# Patient Record
Sex: Female | Born: 2000 | Hispanic: No | Marital: Single | Smoking: Never smoker
Health system: Southern US, Community
[De-identification: ages and names within clinical notes are randomized; demographics above are authoritative.]

## PROBLEM LIST (undated history)

## (undated) HISTORY — PX: KNEE SURGERY: SHX244

---
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
## 2019-03-06 ENCOUNTER — Other Ambulatory Visit: Payer: Self-pay

## 2019-03-06 ENCOUNTER — Ambulatory Visit
Admission: EM | Admit: 2019-03-06 | Discharge: 2019-03-06 | Disposition: A | Discharge status: Home / Self Care | Payer: Self-pay | Attending: Family Medicine | Admitting: Family Medicine

## 2019-03-06 ENCOUNTER — Encounter: Payer: Self-pay | Admitting: Emergency Medicine

## 2019-03-06 DIAGNOSIS — L309 Dermatitis, unspecified: Secondary | ICD-10-CM

## 2019-03-06 DIAGNOSIS — L709 Acne, unspecified: Secondary | ICD-10-CM

## 2019-03-06 MED ORDER — AMOXICILLIN 500 MG PO TABS: 500.0000 mg | ORAL_TABLET | Freq: Two times a day (BID) | ORAL | 0 refills | Status: DC

## 2019-03-06 MED ORDER — TRIAMCINOLONE ACETONIDE 0.1 % EX CREA: 1.0000 "application " | TOPICAL_CREAM | Freq: Two times a day (BID) | CUTANEOUS | 0 refills | Status: DC

## 2019-03-06 NOTE — Discharge Instructions (Signed)
Zyrtec daily plus benadryl as needed

## 2019-03-06 NOTE — ED Triage Notes (Signed)
Patient c/o red itchy bumps on her arms and legs that started 4 months ago.  Patient Patient states that she has not tried any OTC medicines for her itching.

## 2019-03-07 ENCOUNTER — Ambulatory Visit
Admission: EM | Admit: 2019-03-07 | Discharge: 2019-03-07 | Disposition: A | Discharge status: Home / Self Care | Payer: Self-pay | Attending: Family Medicine | Admitting: Family Medicine

## 2019-03-07 ENCOUNTER — Emergency Department
Admission: EM | Admit: 2019-03-07 | Discharge: 2019-03-07 | Disposition: A | Discharge status: Home / Self Care | Payer: Self-pay | Attending: Emergency Medicine | Admitting: Emergency Medicine

## 2019-03-07 ENCOUNTER — Other Ambulatory Visit: Payer: Self-pay

## 2019-03-07 ENCOUNTER — Encounter: Payer: Self-pay | Admitting: Emergency Medicine

## 2019-03-07 DIAGNOSIS — L709 Acne, unspecified: Secondary | ICD-10-CM | POA: Insufficient documentation

## 2019-03-07 DIAGNOSIS — R21 Rash and other nonspecific skin eruption: Secondary | ICD-10-CM | POA: Insufficient documentation

## 2019-03-07 MED ORDER — PERMETHRIN 5 % EX CREA: TOPICAL_CREAM | CUTANEOUS | 0 refills | Status: AC

## 2019-03-07 NOTE — Discharge Instructions (Addendum)
Please continue the medicines that were prescribed by urgent care.  Please follow-up with primary care for recheck.

## 2019-03-07 NOTE — Discharge Instructions (Signed)
Medication as prescribed.  If persists, see Dermatology. I recommend [facility].  Take care  Dr. Adriana Simas

## 2019-03-07 NOTE — ED Triage Notes (Signed)
Pt presents with c/o rash/itchy spots on her body. She states she believes there are mites in her apartment. She states she can see them flying around and she has seen eggs all over her apartment and in her car. She has an area in the inside of her right forearm that is red and itchy. She states when she was in the shower this morning the area was very itchy, she scratched it and picked at it and "a long white thing was moving under her skin". She has pointed out several other small areas, one on her left leg and one on her left hip area, that she states are where bugs have "been under her skin". She is very concerned about mites or scabies.

## 2019-03-07 NOTE — ED Notes (Signed)
Pt states she's had mark on right forearm for about three months but denies injury or cause. Pt states that today she used some tweezers and pulled a "clear, like parasite that fell down on my arm and moved and then crawled back in." Pt states it is itchy and painful at site. Redness and swelling noted on right forearm. CMS intact. NAD noted. Pt behavior, speech, and eye contact/disposition is appropriate.

## 2019-03-07 NOTE — ED Provider Notes (Signed)
[facility] Emergency Department Provider Note  ____________________________________________  Time seen: Approximately 8:56 PM  I have reviewed the triage vital signs and the nursing notes.   HISTORY  Chief Complaint Abscess    HPI Natalie Ruiz is a 19 y.o. female that presents to the emergency department with concerns of a parasite in her right forearm.  Patient went to urgent care just prior to coming to the emergency department.  She states that urgent care told her that they did not think that she think she had a parasite in her arm but gave her a permethrin cream.  She came to the emergency department because she wants it taken out.  Patient states that she sees bugs flying around her house, worms next to her shower, eggs on blankets in her car.  Patient is here today with her girlfriend, who also has a rash to her arm.  Patient denies any previous psych history.  No suicidal or homicidal ideations.  Patient does not take any medications regularly.   History reviewed. No pertinent past medical history.  There are no problems to display for this patient.   Past Surgical History:  Procedure Laterality Date  . KNEE SURGERY      Prior to Admission medications   Medication Sig Start Date End Date Taking? Authorizing Provider  permethrin (ELIMITE) 5 % cream Massage cream from head to soles of feet; leave on for 8 to 14 hours before removing (shower or bath); 03/07/19   Lacinda Axon, Michael Boston G, DO  fluticasone (FLONASE) 50 MCG/ACT nasal spray Place 1 spray into both nostrils 2 (two) times daily. 07/27/17 12/10/18  Cuthriell, Charline Bills, PA-C    Allergies Patient has no known allergies.  Family History  Problem Relation Age of Onset  . Healthy Mother   . Healthy Father     Social History Social History   Tobacco Use  . Smoking status: Never Smoker  . Smokeless tobacco: Never Used  Substance Use Topics  . Alcohol use: Never  . Drug use: Yes    Types: Marijuana      Review of Systems  Constitutional: No fever/chills Cardiovascular: No chest pain. Respiratory: No SOB. Gastrointestinal: No abdominal pain.  No nausea, no vomiting.  Musculoskeletal: Negative for musculoskeletal pain. Skin: Negative for abrasions, lacerations, ecchymosis. Positive for rash. Neurological: Negative for headaches   ____________________________________________   PHYSICAL EXAM:  VITAL SIGNS: ED Triage Vitals  Enc Vitals Group     BP 03/07/19 1928 130/67     Pulse Rate 03/07/19 1928 87     Resp 03/07/19 1928 17     Temp 03/07/19 1928 98.3 F (36.8 C)     Temp Source 03/07/19 1928 Oral     SpO2 03/07/19 1928 99 %     Weight 03/07/19 1928 125 lb (56.7 kg)     Height 03/07/19 1928 5\' 3"  (1.6 m)     Head Circumference --      Peak Flow --      Pain Score 03/07/19 1927 2     Pain Loc --      Pain Edu? --      Excl. in [place]? --      Constitutional: Alert and oriented. Well appearing and in no acute distress. Eyes: Conjunctivae are normal. PERRL. EOMI. Head: Atraumatic.  Acne. ENT:      Ears:      Nose: No congestion/rhinnorhea.      Mouth/Throat: Mucous membranes are moist.  Neck: No stridor.  Cardiovascular: Normal rate, regular rhythm.  Good peripheral circulation. Respiratory: Normal respiratory effort without tachypnea or retractions. Lungs CTAB. Good air entry to the bases with no decreased or absent breath sounds. Musculoskeletal: Full range of motion to all extremities. No gross deformities appreciated. Neurologic:  Normal speech and language. No gross focal neurologic deficits are appreciated.  Skin:  Skin is warm, dry and intact.  Linear crusted lesion to right forearm.  No surrounding erythema. Psychiatric: Mood and affect are normal. Speech and behavior are normal. Patient exhibits appropriate insight and judgement.   ____________________________________________   LABS (all labs ordered are listed, but only abnormal results are  displayed)  Labs Reviewed - No data to display ____________________________________________  EKG   ____________________________________________  RADIOLOGY   No results found.  ____________________________________________    PROCEDURES  Procedure(s) performed:    Procedures    Medications - No data to display   ____________________________________________   INITIAL IMPRESSION / ASSESSMENT AND PLAN / ED COURSE  Pertinent labs & imaging results that were available during my care of the patient were reviewed by me and considered in my medical decision making (see chart for details).  Review of the [place] CSRS was performed in accordance of the [facility] prior to dispensing any controlled drugs.   Patient presents emergency department for evaluation of rash.  Vital signs and exam are reassuring.  Patient was evaluated yesterday at urgent care and was started on amoxicillin and triamcinolone cream.  Today she was evaluated again by urgent care and was started on permethrin cream.  Patient has a scab to her forearm.  No indication of infection.  It is not consistent with an insect bite.  Patient is to follow up with primary care or dermatology as directed. Patient is given ED precautions to return to the ED for any worsening or new symptoms.  Natalie Ruiz was evaluated in Emergency Department on 03/07/2019 for the symptoms described in the history of present illness. She was evaluated in the context of the global COVID-19 pandemic, which necessitated consideration that the patient might be at risk for infection with the SARS-CoV-2 virus that causes COVID-19. Institutional protocols and algorithms that pertain to the evaluation of patients at risk for COVID-19 are in a state of rapid change based on information released by regulatory bodies including the CDC and federal and state organizations. These policies and algorithms were followed during the patient's care in the  ED.   ____________________________________________  FINAL CLINICAL IMPRESSION(S) / ED DIAGNOSES  Final diagnoses:  Rash  Acne, unspecified acne type      NEW MEDICATIONS STARTED DURING THIS VISIT:  ED Discharge Orders    None          This chart was dictated using voice recognition software/Dragon. Despite best efforts to proofread, errors can occur which can change the meaning. Any change was purely unintentional.    Enid Derry, PA-C 03/07/19 2330    Charlynne Pander, MD 03/08/19 [phone]

## 2019-03-07 NOTE — ED Triage Notes (Signed)
Pt arrives with spot on her right forearm. Pt states that she scratched the spot and something long came out of it. Pt reports having a "parasite in her arm". Pt is in NAD.

## 2019-03-08 NOTE — ED Provider Notes (Signed)
[facility]    CSN: [id number] Arrival date & time: 03/07/19  1545   History   Chief Complaint Chief Complaint  Patient presents with  . Rash   HPI  19 year old female presents with rash.  Patient reports that she has recently developed a rash all over. She believes there is a mite infestation in her appt. She describes "eggs" in her sheets/bed. She reports skin itches. Reports scattered areas of erythema. Has an area on her right forearm that she picked at and she felt something under her skin. No relieving factors. No other associated symptoms. No other complaints.    Home Medications    Prior to Admission medications   Medication Sig Start Date End Date Taking? Authorizing Provider  permethrin (ELIMITE) 5 % cream Massage cream from head to soles of feet; leave on for 8 to 14 hours before removing (shower or bath); 03/07/19   Lacinda Axon, Michael Boston G, DO  fluticasone (FLONASE) 50 MCG/ACT nasal spray Place 1 spray into both nostrils 2 (two) times daily. 07/27/17 12/10/18  Cuthriell, Charline Bills, PA-C    Family History Family History  Problem Relation Age of Onset  . Healthy Mother   . Healthy Father     Social History Social History   Tobacco Use  . Smoking status: Never Smoker  . Smokeless tobacco: Never Used  Substance Use Topics  . Alcohol use: Never  . Drug use: Yes    Types: Marijuana     Allergies   Patient has no known allergies.   Review of Systems Review of Systems  Constitutional: Negative.   Skin: Positive for rash.   Physical Exam Triage Vital Signs ED Triage Vitals  Enc Vitals Group     BP 03/07/19 1608 121/79     Pulse Rate 03/07/19 1608 87     Resp 03/07/19 1608 18     Temp 03/07/19 1608 98.9 F (37.2 C)     Temp Source 03/07/19 1608 Oral     SpO2 03/07/19 1608 100 %     Weight 03/07/19 1607 130 lb (59 kg)     Height 03/07/19 1607 5\' 3"  (1.6 m)     Head Circumference --      Peak Flow --      Pain Score 03/07/19 1605 3     Pain Loc  --      Pain Edu? --      Excl. in [place]? --    Updated Vital Signs BP 121/79 (BP Location: Left Arm)   Pulse 87   Temp 98.9 F (37.2 C) (Oral)   Resp 18   Ht 5\' 3"  (1.6 m)   Wt 59 kg   LMP 03/07/2019 (Exact Date)   SpO2 100%   BMI 23.03 kg/m   Visual Acuity Right Eye Distance:   Left Eye Distance:   Bilateral Distance:    Right Eye Near:   Left Eye Near:    Bilateral Near:     Physical Exam Vitals and nursing note reviewed.  Constitutional:      General: She is not in acute distress.    Appearance: Normal appearance. She is not ill-appearing.  HENT:     Head: Normocephalic and atraumatic.  Eyes:     General:        Right eye: No discharge.        Left eye: No discharge.     Conjunctiva/sclera: Conjunctivae normal.  Cardiovascular:     Rate and Rhythm: Normal rate  and regular rhythm.  Pulmonary:     Effort: Pulmonary effort is normal.     Breath sounds: Normal breath sounds.  Skin:    Comments: 2 areas of rash noted - Right forearm and left lower abdomen. No other significant areas of rash noted. Rash is flat and erythematous.   Neurological:     Mental Status: She is alert.  Psychiatric:     Comments: Anxious.     UC Treatments / Results  Labs (all labs ordered are listed, but only abnormal results are displayed) Labs Reviewed - No data to display  EKG   Radiology No results found.  Procedures Procedures (including critical care time)  Medications Ordered in UC Medications - No data to display  Initial Impression / Assessment and Plan / UC Course  I have reviewed the triage vital signs and the nursing notes.  Pertinent labs & imaging results that were available during my care of the patient were reviewed by me and considered in my medical decision making (see chart for details).    18 year old female presents with rash. Etiology is uncertain. Patient is adamant that this is from mites or scabies. Empiric permethrin. Advised to see Derm if  persists.  Final Clinical Impressions(s) / UC Diagnoses   Final diagnoses:  Rash     Discharge Instructions     Medication as prescribed.  If persists, see Dermatology. I recommend [facility].  Take care  Dr. Adriana Simas    ED Prescriptions    Medication Sig Dispense Auth. Provider   permethrin (ELIMITE) 5 % cream Massage cream from head to soles of feet; leave on for 8 to 14 hours before removing (shower or bath); 60 g Tommie Sams, DO     PDMP not reviewed this encounter.   Tommie Sams, [place] 03/08/19 2158

## 2019-03-09 NOTE — ED Provider Notes (Signed)
[facility]    CSN: [id number] Arrival date & time: 03/06/19  1338      History   Chief Complaint Chief Complaint  Patient presents with  . Rash    HPI Natalie Ruiz is a 19 y.o. female.   19 yo female with a c/o red, itchy bumps on her arms and legs for the past 4 months. Also c/o "pimples" on her face. States she thinks this is due to mite infestation in her house. Denies any fevers, chills, drainage.    Rash   History reviewed. No pertinent past medical history.  There are no problems to display for this patient.   Past Surgical History:  Procedure Laterality Date  . KNEE SURGERY      OB History   No obstetric history on file.      Home Medications    Prior to Admission medications   Medication Sig Start Date End Date Taking? Authorizing Provider  permethrin (ELIMITE) 5 % cream Massage cream from head to soles of feet; leave on for 8 to 14 hours before removing (shower or bath); 03/07/19   Adriana Simas, Dorie Rank G, DO  fluticasone (FLONASE) 50 MCG/ACT nasal spray Place 1 spray into both nostrils 2 (two) times daily. 07/27/17 12/10/18  Cuthriell, Delorise Royals, PA-C    Family History Family History  Problem Relation Age of Onset  . Healthy Mother   . Healthy Father     Social History Social History   Tobacco Use  . Smoking status: Never Smoker  . Smokeless tobacco: Never Used  Substance Use Topics  . Alcohol use: Never  . Drug use: Yes    Types: Marijuana     Allergies   Patient has no known allergies.   Review of Systems Review of Systems  Skin: Positive for rash.     Physical Exam Triage Vital Signs ED Triage Vitals  Enc Vitals Group     BP 03/06/19 1353 (!) 129/52     Pulse Rate 03/06/19 1353 91     Resp 03/06/19 1353 14     Temp 03/06/19 1353 98.3 F (36.8 C)     Temp Source 03/06/19 1353 Oral     SpO2 03/06/19 1353 100 %     Weight 03/06/19 1350 125 lb (56.7 kg)     Height 03/06/19 1350 5\' 3"  (1.6 m)     Head Circumference --       Peak Flow --      Pain Score 03/06/19 1349 0     Pain Loc --      Pain Edu? --      Excl. in [place]? --    No data found.  Updated Vital Signs BP (!) 129/52 (BP Location: Right Arm)   Pulse 91   Temp 98.3 F (36.8 C) (Oral)   Resp 14   Ht 5\' 3"  (1.6 m)   Wt 56.7 kg   LMP 02/03/2019 (Approximate)   SpO2 100%   BMI 22.14 kg/m   Visual Acuity Right Eye Distance:   Left Eye Distance:   Bilateral Distance:    Right Eye Near:   Left Eye Near:    Bilateral Near:     Physical Exam Vitals and nursing note reviewed.  Constitutional:      General: She is not in acute distress.    Appearance: She is not toxic-appearing or diaphoretic.  Skin:    Comments: Scaly, erythematous and pinpoint papules on arms and legs; few, scattered pustules on face  c/w acne  Neurological:     Mental Status: She is alert.      UC Treatments / Results  Labs (all labs ordered are listed, but only abnormal results are displayed) Labs Reviewed - No data to display  EKG   Radiology No results found.  Procedures Procedures (including critical care time)  Medications Ordered in UC Medications - No data to display  Initial Impression / Assessment and Plan / UC Course  I have reviewed the triage vital signs and the nursing notes.  Pertinent labs & imaging results that were available during my care of the patient were reviewed by me and considered in my medical decision making (see chart for details).      Final Clinical Impressions(s) / UC Diagnoses   Final diagnoses:  Acne, unspecified acne type  Eczema, unspecified type     Discharge Instructions     Zyrtec daily plus benadryl as needed    ED Prescriptions    Medication Sig Dispense Auth. Provider   triamcinolone cream (KENALOG) 0.1 % Apply 1 application topically 2 (two) times daily. 30 g Norval Gable, MD   amoxicillin (AMOXIL) 500 MG tablet Take 1 tablet (500 mg total) by mouth 2 (two) times daily. 20 tablet Norval Gable, MD     1.  diagnosis reviewed with patient 2. rx as per orders above; reviewed possible side effects, interactions, risks and benefits  3. Recommend supportive treatment as above 4. Follow-up prn if symptoms worsen or don't improve  PDMP not reviewed this encounter.   Norval Gable, MD 03/09/19 1949

## 2019-07-22 ENCOUNTER — Telehealth: Payer: Self-pay | Admitting: General Practice

## 2019-07-22 NOTE — Telephone Encounter (Signed)
Individual has been contacted 3+ times regarding ED referral. No further attempts to contact individual will be made. 

## 2019-08-02 ENCOUNTER — Emergency Department (HOSPITAL_COMMUNITY)
Admission: EM | Admit: 2019-08-02 | Discharge: 2019-08-02 | Disposition: A | Discharge status: Home / Self Care | Payer: Self-pay | Attending: Emergency Medicine | Admitting: Emergency Medicine

## 2019-08-02 ENCOUNTER — Encounter (HOSPITAL_COMMUNITY): Payer: Self-pay

## 2019-08-02 ENCOUNTER — Other Ambulatory Visit: Payer: Self-pay

## 2019-08-02 ENCOUNTER — Emergency Department (HOSPITAL_COMMUNITY): Payer: Self-pay

## 2019-08-02 DIAGNOSIS — K649 Unspecified hemorrhoids: Secondary | ICD-10-CM | POA: Insufficient documentation

## 2019-08-02 DIAGNOSIS — R05 Cough: Secondary | ICD-10-CM | POA: Insufficient documentation

## 2019-08-02 DIAGNOSIS — Z20822 Contact with and (suspected) exposure to covid-19: Secondary | ICD-10-CM | POA: Insufficient documentation

## 2019-08-02 DIAGNOSIS — R072 Precordial pain: Secondary | ICD-10-CM | POA: Insufficient documentation

## 2019-08-02 DIAGNOSIS — F419 Anxiety disorder, unspecified: Secondary | ICD-10-CM | POA: Insufficient documentation

## 2019-08-02 DIAGNOSIS — R0602 Shortness of breath: Secondary | ICD-10-CM | POA: Insufficient documentation

## 2019-08-02 LAB — BASIC METABOLIC PANEL
Anion gap: 8 (ref 5–15)
BUN: 6 mg/dL (ref 6–20)
CO2: 26 mmol/L (ref 22–32)
Calcium: 9 mg/dL (ref 8.9–10.3)
Chloride: 104 mmol/L (ref 98–111)
Creatinine, Ser: 0.67 mg/dL (ref 0.44–1.00)
GFR calc Af Amer: >60 mL/min (ref >60)
GFR calc non Af Amer: >60 mL/min (ref >60)
Glucose, Bld: 85 mg/dL (ref 70–99)
Potassium: 3.6 mmol/L (ref 3.5–5.1)
Sodium: 138 mmol/L (ref 135–145)

## 2019-08-02 LAB — CBC
HCT: 37.3 % (ref 36.0–46.0)
Hemoglobin: 12.6 g/dL (ref 12.0–15.0)
MCH: 30 pg (ref 26.0–34.0)
MCHC: 33.8 g/dL (ref 30.0–36.0)
MCV: 88.8 fL (ref 80.0–100.0)
Platelets: 216 10*3/uL (ref 150–400)
RBC: 4.2 MIL/uL (ref 3.87–5.11)
RDW: 13 % (ref 11.5–15.5)
WBC: 8.2 10*3/uL (ref 4.0–10.5)
nRBC: 0 % (ref 0.0–0.2)

## 2019-08-02 LAB — TROPONIN I (HIGH SENSITIVITY): Troponin I (High Sensitivity): <2 ng/L (ref <18)

## 2019-08-02 LAB — I-STAT BETA HCG BLOOD, ED (MC, WL, AP ONLY): I-stat hCG, quantitative: <5 m[IU]/mL (ref <5)

## 2019-08-02 LAB — D-DIMER, QUANTITATIVE: D-Dimer, Quant: <0.27 ug/mL-FEU (ref 0.00–0.50)

## 2019-08-02 LAB — SARS CORONAVIRUS 2 BY RT PCR (HOSPITAL ORDER, PERFORMED IN ~~LOC~~ HOSPITAL LAB): SARS Coronavirus 2: NEGATIVE

## 2019-08-02 LAB — POC OCCULT BLOOD, ED: Fecal Occult Bld: NEGATIVE

## 2019-08-02 MED ORDER — HYDROXYZINE HCL 25 MG PO TABS: 25.0000 mg | ORAL_TABLET | Freq: Four times a day (QID) | ORAL | 0 refills | Status: AC

## 2019-08-02 MED ORDER — SODIUM CHLORIDE 0.9 % IV BOLUS
1000.0000 mL | Freq: Once | INTRAVENOUS | Status: AC
  Administered 2019-08-02: 1000 mL via INTRAVENOUS

## 2019-08-02 MED ORDER — POLYETHYLENE GLYCOL 3350 17 G PO PACK: 17.0000 g | PACK | Freq: Every day | ORAL | 0 refills | Status: AC

## 2019-08-02 MED ORDER — SODIUM CHLORIDE 0.9% FLUSH
3.0000 mL | Freq: Once | INTRAVENOUS | Status: AC
  Administered 2019-08-02: 3 mL via INTRAVENOUS

## 2019-08-02 NOTE — ED Notes (Signed)
Patient refused lab draw at this time.  

## 2019-08-02 NOTE — ED Notes (Signed)
Pt verbalizes understanding of DC instructions. Pt belongings returned and is ambulatory out of ED.  

## 2019-08-02 NOTE — ED Triage Notes (Signed)
Pt presents with c/o shortness of breath and blood in her stool. Pt also c/o pain in her chest, heaviness in nature associated with her shortness of breath. Pt reports the blood in her stool is recent.

## 2019-08-02 NOTE — Discharge Instructions (Signed)
Follow up with Primary care provider  Return for new or worsening symptoms 

## 2019-08-02 NOTE — ED Notes (Signed)
No call from pt when called for room x 1.  

## 2019-08-02 NOTE — ED Provider Notes (Signed)
[facility]-EMERGENCY DEPT Provider Note   CSN: [id number] Arrival date & time: 08/02/19  [id number]     History Chief Complaint  Patient presents with  . Shortness of Breath  . Rectal Bleeding  . Chest Pain    Natalie Ruiz is a 19 y.o. female with multiple complaints.  Patient with chest pain x1 month.  It is there constantly.  Not pleuritic or exertional in nature.  States occasionally she will get short of breath at night.  Patient states also at night she feels like she is "tingling sensation" throughout her whole body.  States she does have mind racing.  She denies any prior history of anxiety.  No unilateral leg swelling, redness or warmth.  No prior history of PE, DVT, family or personal history of clotting disorders.  No surgical history, malignancy, immobilization.  No hemoptysis.  Patient also states 2 days ago she had rectal bleeding with wiping.  States she had 1 day of diarrhea proximal 1 month ago however has had "normal" stools.  Her last bowel movement was 2 days ago.  She goes approximately every 2 to 3 days.  States her stools have been "firm."  She has not taken anything for this.  Has not had any subsequent rectal bleeding.  No headache, lightheadedness, dizziness, syncope, abdominal pain, diarrhea, dysuria.  Patient states after she eats a big meal she gets a "gurgling sensation."  In her stomach. No abd pain.  Denies additional aggravating or relieving factors.  Patient did state that her mother has had a cough as well as her and her mother was tested for Covid 2 days ago and is pending the results.  History obtained from patient and past medical records.  No interpreter used.  HPI     History reviewed. No pertinent past medical history.  There are no problems to display for this patient.   Past Surgical History:  Procedure Laterality Date  . KNEE SURGERY       OB History   No obstetric history on file.     Family History  Problem Relation  Age of Onset  . Healthy Mother   . Healthy Father     Social History   Tobacco Use  . Smoking status: Never Smoker  . Smokeless tobacco: Never Used  Vaping Use  . Vaping Use: Never used  Substance Use Topics  . Alcohol use: Never  . Drug use: Yes    Types: Marijuana    Home Medications Prior to Admission medications   Medication Sig Start Date End Date Taking? Authorizing Provider  hydrOXYzine (ATARAX/VISTARIL) 25 MG tablet Take 1 tablet (25 mg total) by mouth every 6 (six) hours. 08/02/19   Porsha Skilton A, PA-C  permethrin (ELIMITE) 5 % cream Massage cream from head to soles of feet; leave on for 8 to 14 hours before removing (shower or bath); Patient not taking: Reported on 08/02/2019 03/07/19   Tommie Samsook, Jayce G, DO  polyethylene glycol (MIRALAX) 17 g packet Take 17 g by mouth daily. 08/02/19   Konner Warrior A, PA-C  fluticasone (FLONASE) 50 MCG/ACT nasal spray Place 1 spray into both nostrils 2 (two) times daily. 07/27/17 12/10/18  Cuthriell, Delorise RoyalsJonathan D, PA-C    Allergies    Patient has no known allergies.  Review of Systems   Review of Systems  Constitutional: Negative.   HENT: Negative.   Respiratory: Positive for cough and shortness of breath (At night). Negative for apnea, choking, chest tightness, wheezing and stridor.  Cardiovascular: Positive for chest pain. Negative for palpitations and leg swelling.  Gastrointestinal: Negative.        Gurgling after eating  Genitourinary: Negative.   Musculoskeletal: Negative.   Skin: Negative.   Neurological: Negative.   All other systems reviewed and are negative.   Physical Exam Updated Vital Signs BP 120/75   Pulse 80   Temp 98.2 F (36.8 C) (Oral)   Resp 18   Ht 5\' 3"  (1.6 m)   Wt 56.7 kg   LMP 07/08/2019 (Approximate)   SpO2 99%   BMI 22.14 kg/m   Physical Exam Vitals and nursing note reviewed. Exam conducted with a chaperone present.  Constitutional:      General: She is not in acute distress.     Appearance: She is not ill-appearing, toxic-appearing or diaphoretic.  HENT:     Head: Normocephalic and atraumatic.     Jaw: There is normal jaw occlusion.     Right Ear: Tympanic membrane, ear canal and external ear normal. There is no impacted cerumen. No hemotympanum. Tympanic membrane is not injected, scarred, perforated, erythematous, retracted or bulging.     Left Ear: Tympanic membrane, ear canal and external ear normal. There is no impacted cerumen. No hemotympanum. Tympanic membrane is not injected, scarred, perforated, erythematous, retracted or bulging.     Ears:     Comments: No Mastoid tenderness.    Nose:     Comments: Clear rhinorrhea and congestion to bilateral nares.  No sinus tenderness.    Mouth/Throat:     Comments: Posterior oropharynx clear.  Mucous membranes moist.  Tonsils without erythema or exudate.  Uvula midline without deviation.  No evidence of PTA or RPA.  No drooling, dysphasia or trismus.  Phonation normal. Neck:     Trachea: Trachea and phonation normal.     Meningeal: Brudzinski's sign and Kernig's sign absent.     Comments: No Neck stiffness or neck rigidity.  No meningismus.  No cervical lymphadenopathy. Cardiovascular:     Comments: No murmurs rubs or gallops. Pulmonary:     Comments: Clear to auscultation bilaterally without wheeze, rhonchi or rales.  No accessory muscle usage.  Able speak in full sentences. Abdominal:     Comments: Soft, nontender without rebound or guarding.  No CVA tenderness.  Genitourinary:    Rectum: Guaiac result negative. Internal hemorrhoid present. No tenderness or external hemorrhoid.     Comments: Light brown stool in rectal vault. Occult negative. Musculoskeletal:     Comments: Moves all 4 extremities without difficulty.  Lower extremities without edema, erythema or warmth.  Skin:    Comments: Brisk capillary refill.  No rashes or lesions.  Neurological:     Mental Status: She is alert.     Comments: Ambulatory in  department without difficulty.  Cranial nerves II through XII grossly intact.  No facial droop.  No aphasia.    ED Results / Procedures / Treatments   Labs (all labs ordered are listed, but only abnormal results are displayed) Labs Reviewed  SARS CORONAVIRUS 2 BY RT PCR (HOSPITAL ORDER, PERFORMED IN [facility])  BASIC METABOLIC PANEL  CBC  D-DIMER, QUANTITATIVE (NOT AT [facility])  OCCULT BLOOD X 1 CARD TO LAB, STOOL  I-STAT BETA HCG BLOOD, ED ([facility], [facility], [facility] ONLY)  POC OCCULT BLOOD, ED  TROPONIN I (HIGH SENSITIVITY)    EKG EKG Interpretation  Date/Time:  Sunday August 02 2019 07:46:05 EDT Ventricular Rate:  76 PR Interval:  116 QRS Duration: 70  QT Interval:  366 QTC Calculation: 411 R Axis:   85 Text Interpretation: Normal sinus rhythm Normal ECG since last tracing no significant change Confirmed by Mancel Bale [phone]) on 08/02/2019 11:29:41 AM   Radiology DG Chest 2 View  Result Date: 08/02/2019 CLINICAL DATA:  Chest pain and SOB. EXAM: CHEST - 2 VIEW COMPARISON:  07/27/2017 FINDINGS: The heart size and mediastinal contours are within normal limits. Both lungs are clear. The visualized skeletal structures are unremarkable. IMPRESSION: No active cardiopulmonary disease. Electronically Signed   By: Signa Kell M.D.   On: 08/02/2019 08:18   Procedures Procedures (including critical care time)  Medications Ordered in ED Medications  sodium chloride flush (NS) 0.9 % injection 3 mL (3 mLs Intravenous Given 08/02/19 1215)  sodium chloride 0.9 % bolus 1,000 mL (1,000 mLs Intravenous New Bag/Given 08/02/19 1237)   ED Course  I have reviewed the triage vital signs and the nursing notes.  Pertinent labs & imaging results that were available during my care of the patient were reviewed by me and considered in my medical decision making (see chart for details).  18 presents for multiple complaints.  She is afebrile, nonseptic, not ill-appearing.  #1-BRBPR days ago with  bowel movement.  Has history of intermittent constipation.  Light brown stool in rectal vault, negative occult.  She does have internal hemorrhoid.  Her abdomen is soft, nontender.  No rebound or guarding.  No chronic NSAID use, alcohol use.  Suspect patient's bleeding from her hemorrhoid.  BUN 6.  Discussed stool softeners at home.  #2-Patient with chest pain x1 month.  Nonexertional pleuritic in nature.  Does not radiate to left arm, left jaw or back.  No associate diaphoresis, nausea or vomiting.  She is PERC negative, Wells criteria low risk. Ddimer <0.27.  Patient for ACS, PE, dissection, bacterial infectious process, pneumothorax.  Symptoms actually seem to related more towards anxiety.  Has some shortness of breath and "tingling" sensation at night. Troponin <2. EKG without STEMI, delta wave, Brugada. Start on Atarax, have her follow-up with PCP.  #3- Patient with COVID Symptoms.  Not vaccinated towards Covid.  Mother and family having similar complaints.  They have been tested for Covid however do not know the results.  Chest x-ray denies any evidence of infiltrates, cardiomegaly, pleural edema, pneumothorax. Heart and lungs clear. No evidence of respiratory distress. Will test and have patient isolate at home until results.  Labs imaging personally viewed interpreted  Discussed imaging and labs with patient.  No acute findings.  DC home with symptomatic management.  Her Covid test is pending.  She understands to self isolate until this returns. Tolerating PO intake without difficulty.  Chest pain is not likely of cardiac or pulmonary etiology d/t presentation, PERC negative, VSS, no tracheal deviation, no JVD or new murmur, RRR, breath sounds equal bilaterally, EKG without acute abnormalities, negative troponin, and negative CXR. Pt has been advised to return to the ED if CP becomes exertional, associated with diaphoresis or nausea, radiates to left jaw/arm, worsens or becomes concerning in any  way.   The patient has been appropriately medically screened and/or stabilized in the ED. I have low suspicion for any other emergent medical condition which would require further screening, evaluation or treatment in the ED or require inpatient management.  Patient is hemodynamically stable and in no acute distress.  Patient able to ambulate in department prior to ED.  Evaluation does not show acute pathology that would require ongoing or additional emergent  interventions while in the emergency department or further inpatient treatment.  I have discussed the diagnosis with the patient and answered all questions.  Pain is been managed while in the emergency department and patient has no further complaints prior to discharge.  Patient is comfortable with plan discussed in room and is stable for discharge at this time.  I have discussed strict return precautions for returning to the emergency department.  Patient was encouraged to follow-up with PCP/specialist refer to at discharge.     MDM Rules/Calculators/A&P                          Natalie Ruiz was evaluated in Emergency Department on 08/02/2019 for the symptoms described in the history of present illness. She was evaluated in the context of the global COVID-19 pandemic, which necessitated consideration that the patient might be at risk for infection with the SARS-CoV-2 virus that causes COVID-19. Institutional protocols and algorithms that pertain to the evaluation of patients at risk for COVID-19 are in a state of rapid change based on information released by regulatory bodies including the CDC and federal and state organizations. These policies and algorithms were followed during the patient's care in the ED. Final Clinical Impression(s) / ED Diagnoses Final diagnoses:  Precordial pain  Hemorrhoids, unspecified hemorrhoid type  Anxiety    Rx / DC Orders ED Discharge Orders         Ordered    polyethylene glycol (MIRALAX) 17 g packet  Daily      Discontinue  Reprint     08/02/19 1419    hydrOXYzine (ATARAX/VISTARIL) 25 MG tablet  Every 6 hours     Discontinue  Reprint     08/02/19 1419           Stacyann Mcconaughy A, PA-C 08/02/19 1430    Mancel Bale, MD 08/03/19 1749

## 2019-08-13 IMAGING — CR DG CHEST 2V
1 series · 2 of 2 positions shown · non-contrast
Comparison: None.

CLINICAL DATA: Cough for 2 weeks.

EXAM:
CHEST - 2 VIEW

[Series 1: dg chest 2 view · 0.14mm/px · 2 of 2 slices shown]
[im 1/2]
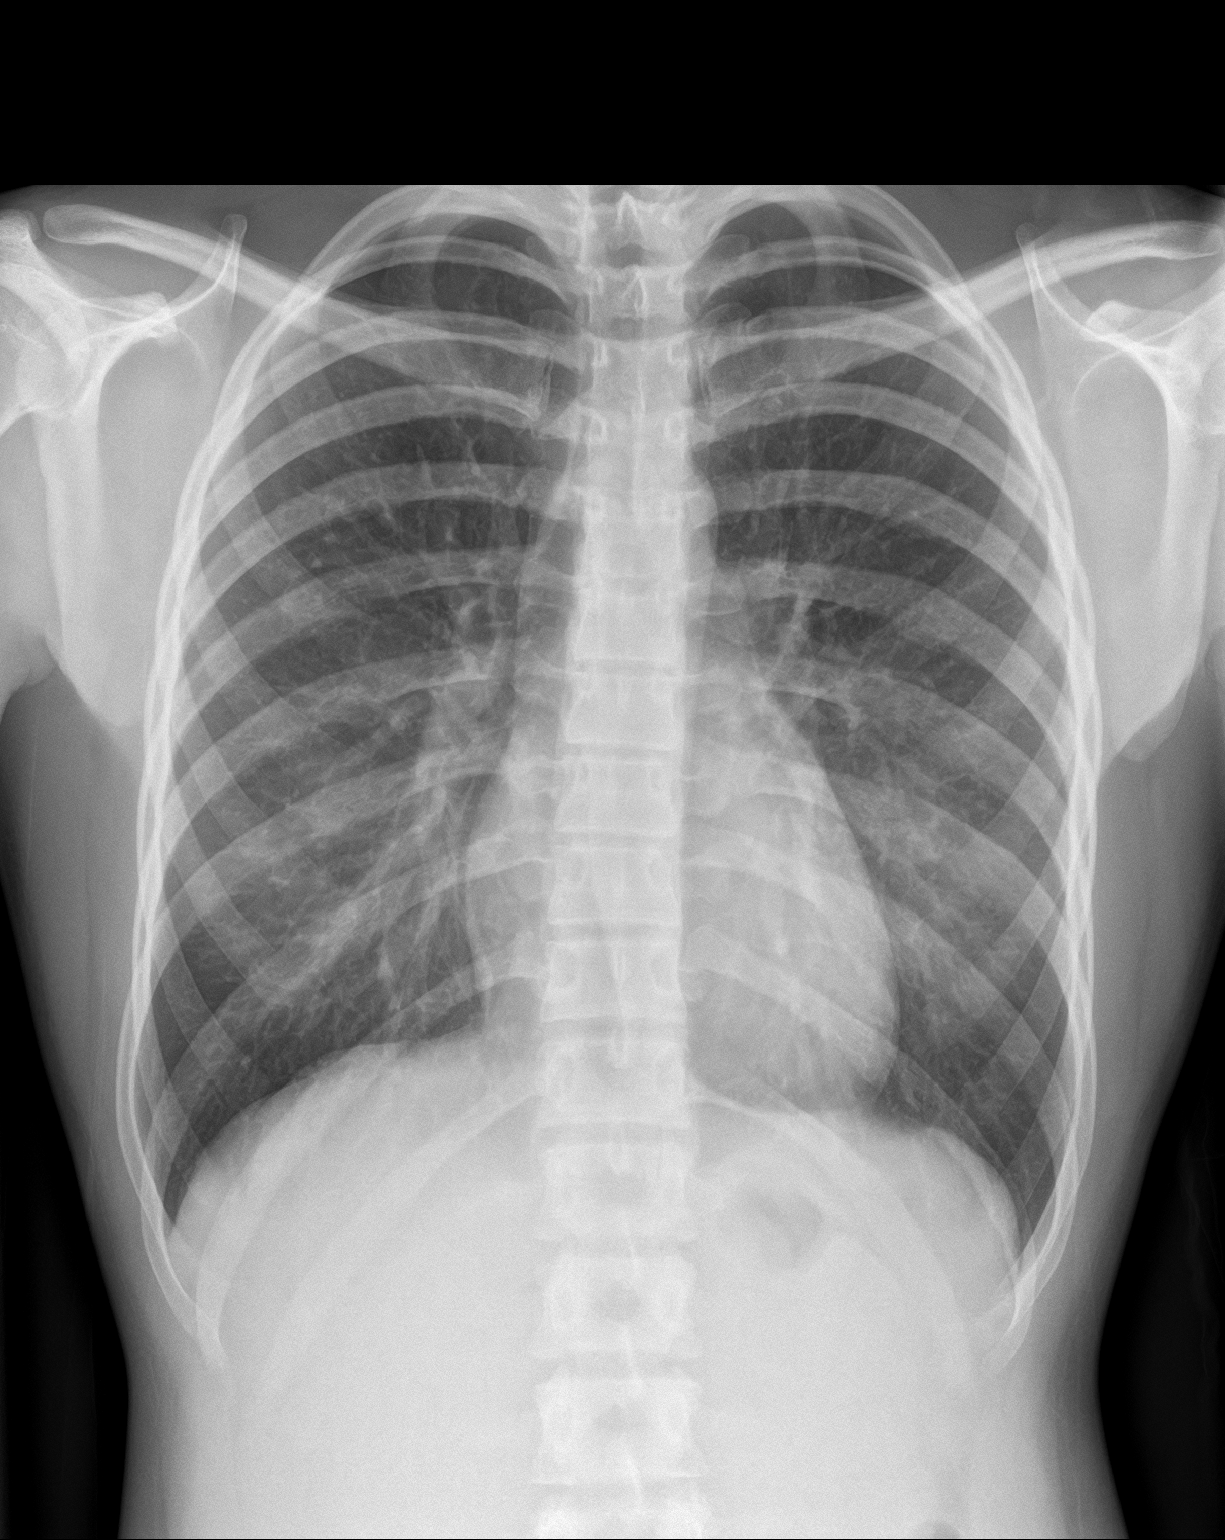
[im 2/2]
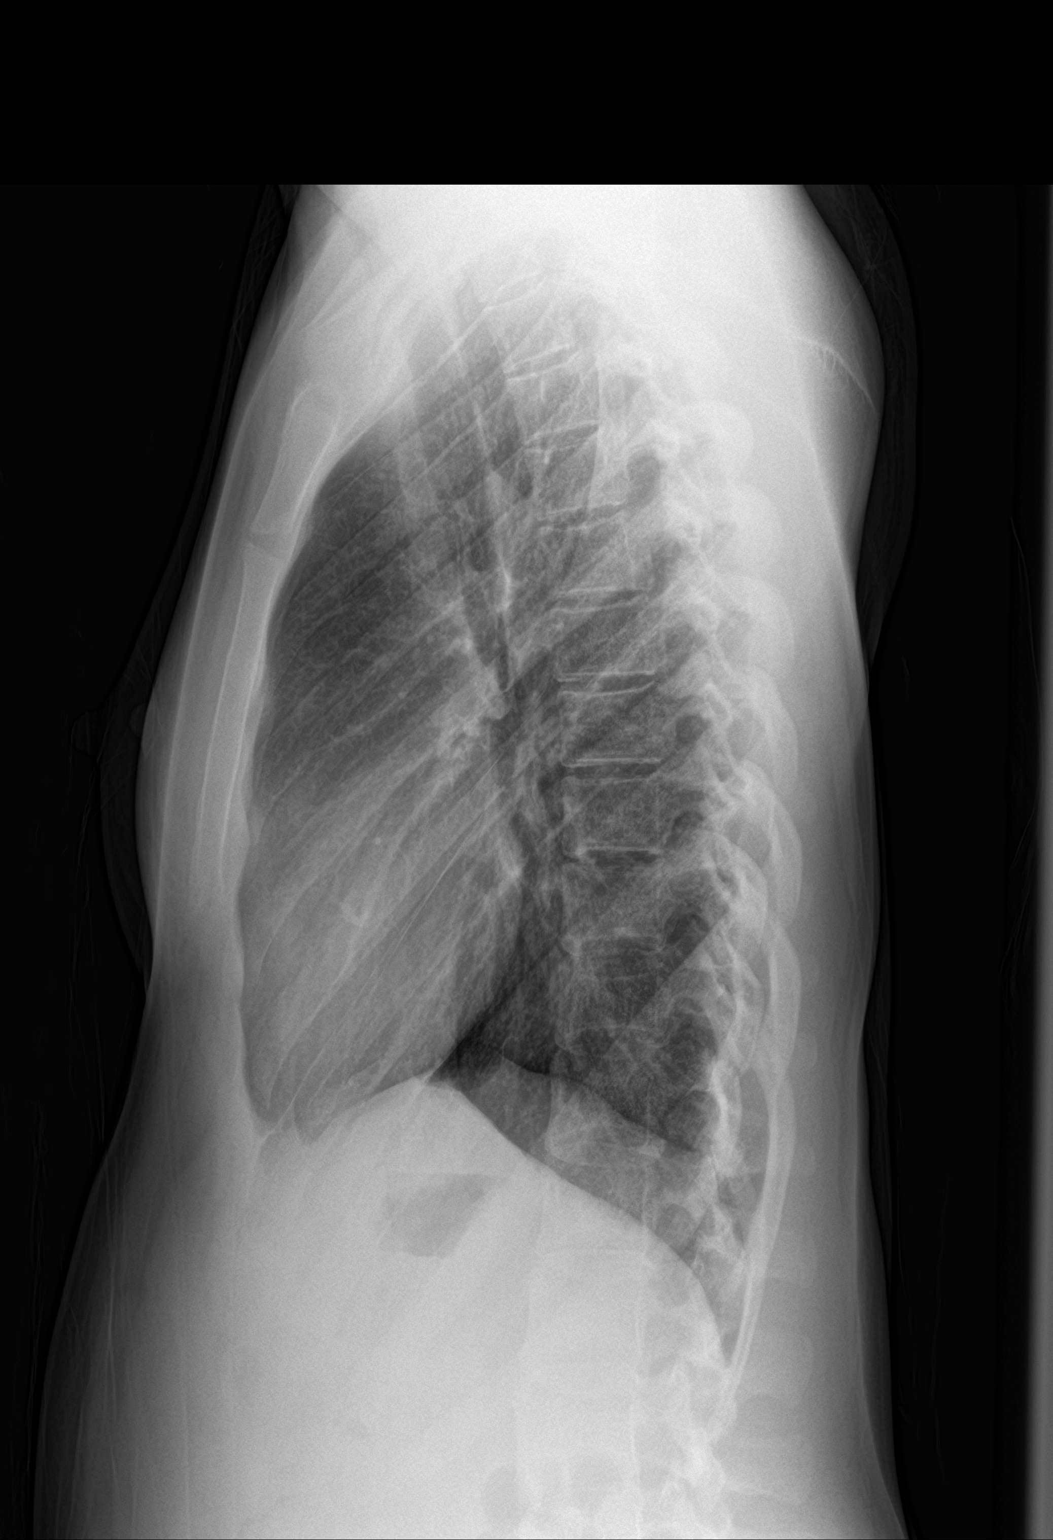

[2 of 2 positions shown; findings below may reference images not displayed]

FINDINGS: The heart size and mediastinal contours are within normal limits.
Both lungs are clear. The visualized skeletal structures are
unremarkable.
IMPRESSION: Negative.  No active cardiopulmonary disease.

## 2021-08-18 IMAGING — CR DG CHEST 2V
2 series · 2 of 2 positions shown · non-contrast
Comparison: 07/27/2017

CLINICAL DATA: Chest pain and SOB.

EXAM:
CHEST - 2 VIEW

[w chest pa]
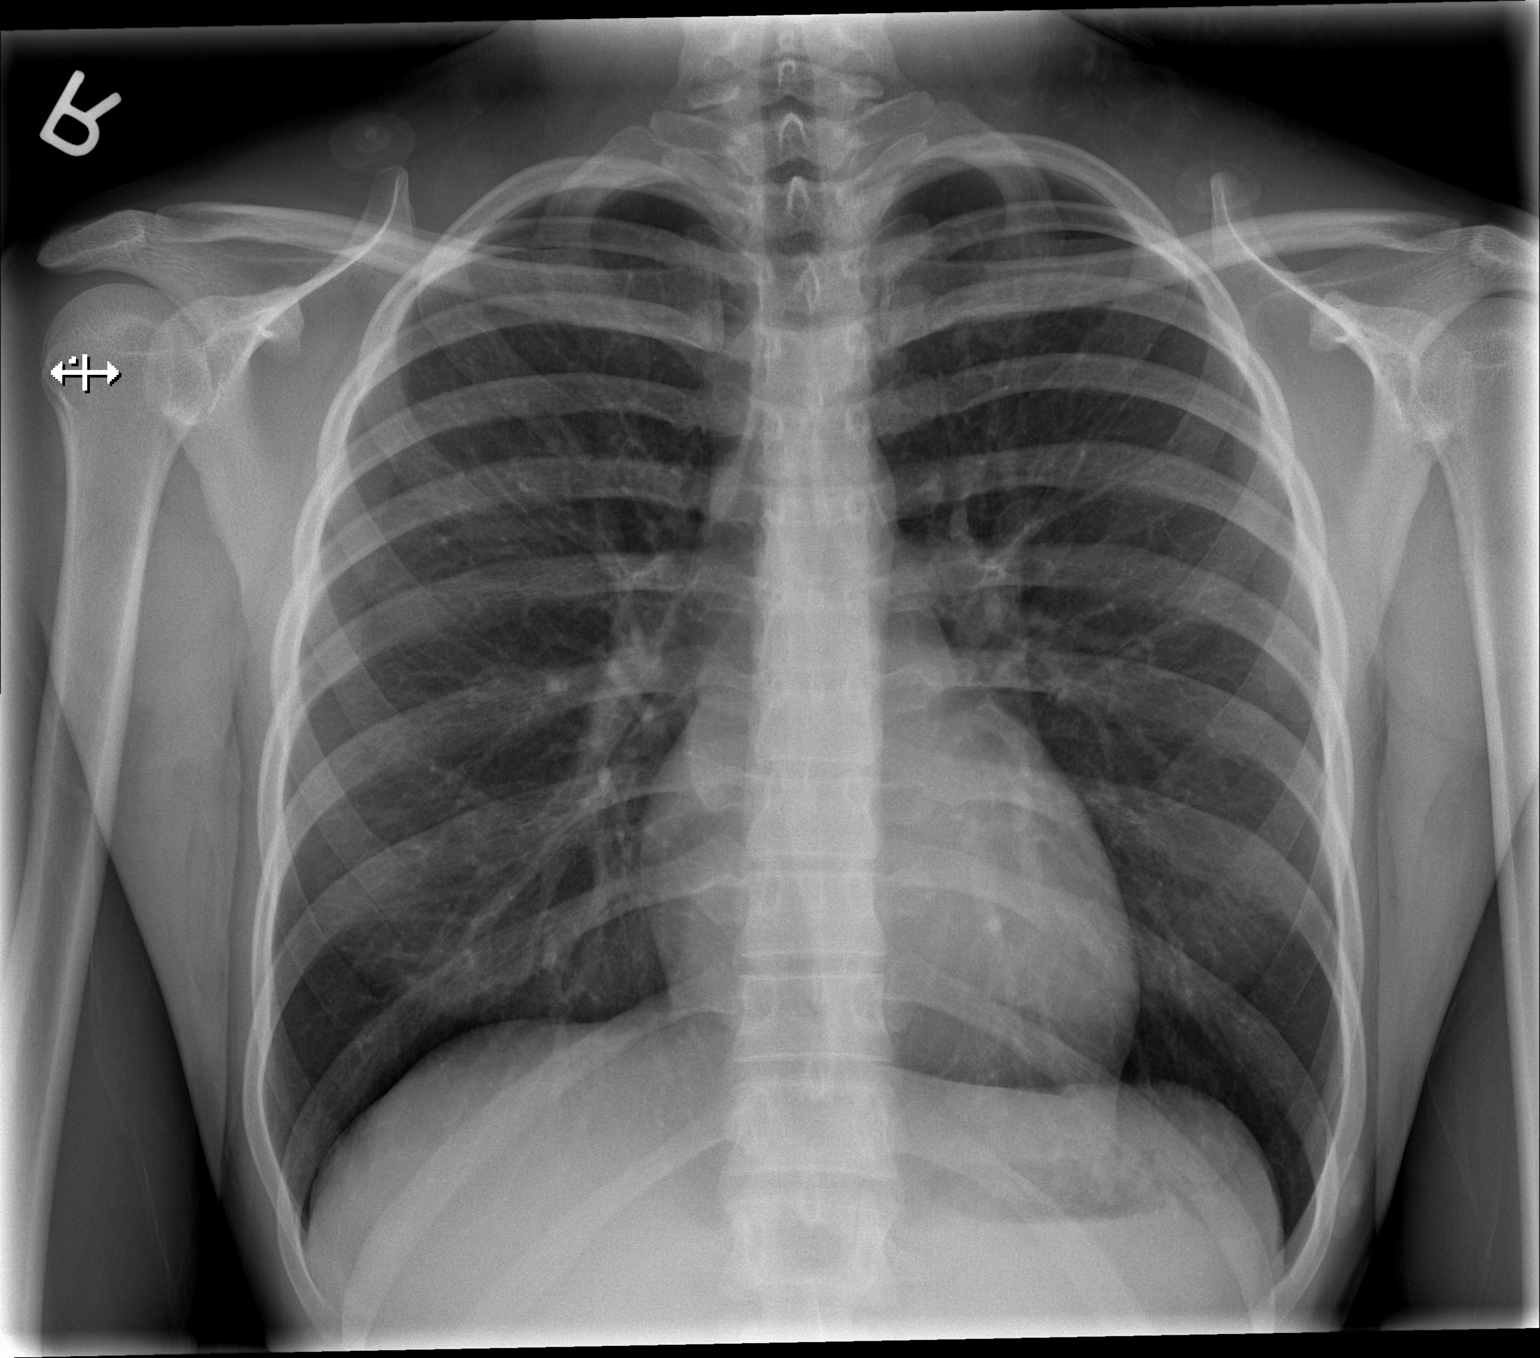

[w chest lat]
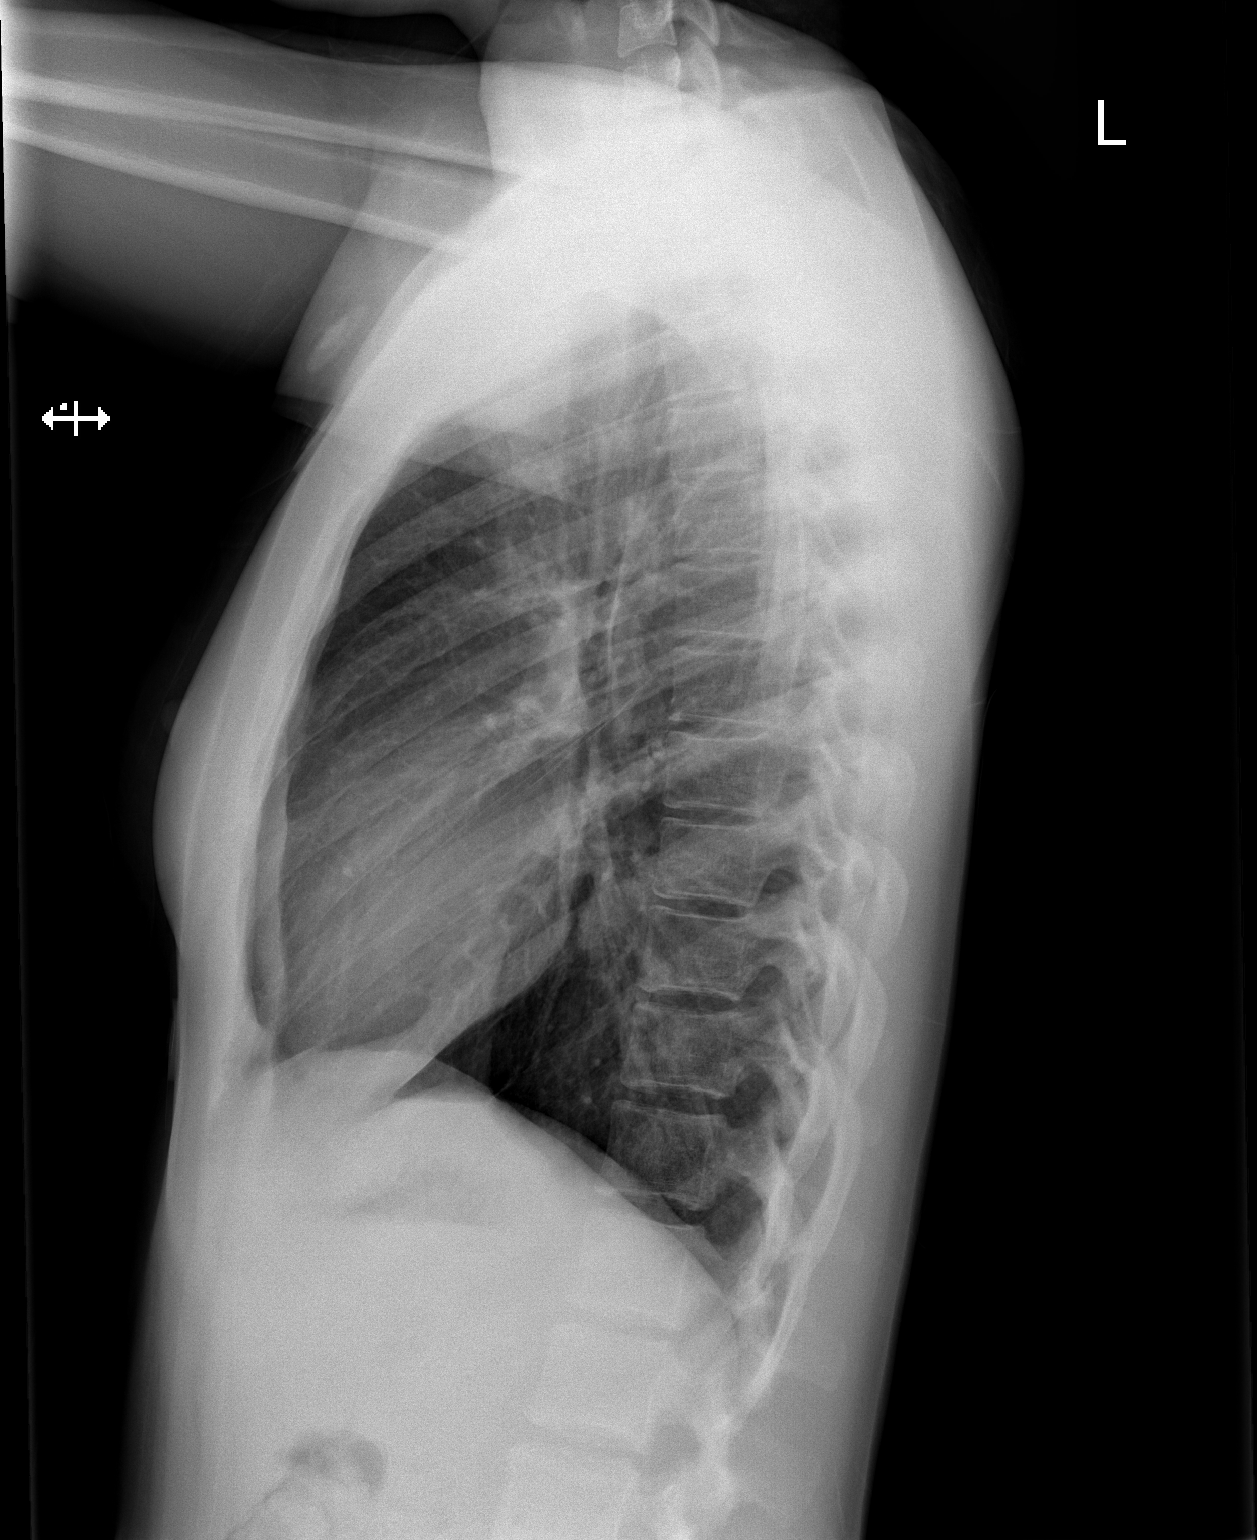

[2 of 2 positions shown; findings below may reference images not displayed]

FINDINGS: The heart size and mediastinal contours are within normal limits.
Both lungs are clear. The visualized skeletal structures are
unremarkable.
IMPRESSION: No active cardiopulmonary disease.

## 2021-10-19 ENCOUNTER — Emergency Department (HOSPITAL_COMMUNITY)
Admission: EM | Admit: 2021-10-19 | Discharge: 2021-10-19 | Disposition: A | Discharge status: Home / Self Care | Payer: Self-pay | Attending: Emergency Medicine | Admitting: Emergency Medicine

## 2021-10-19 ENCOUNTER — Encounter (HOSPITAL_COMMUNITY): Payer: Self-pay | Admitting: Emergency Medicine

## 2021-10-19 DIAGNOSIS — M549 Dorsalgia, unspecified: Secondary | ICD-10-CM | POA: Insufficient documentation

## 2021-10-19 DIAGNOSIS — R519 Headache, unspecified: Secondary | ICD-10-CM | POA: Insufficient documentation

## 2021-10-19 DIAGNOSIS — R1013 Epigastric pain: Secondary | ICD-10-CM | POA: Insufficient documentation

## 2021-10-19 DIAGNOSIS — M542 Cervicalgia: Secondary | ICD-10-CM | POA: Insufficient documentation

## 2021-10-19 DIAGNOSIS — M79606 Pain in leg, unspecified: Secondary | ICD-10-CM | POA: Insufficient documentation

## 2021-10-19 LAB — COMPREHENSIVE METABOLIC PANEL
ALT: 24 U/L (ref 0–44)
AST: 22 U/L (ref 15–41)
Albumin: 4.1 g/dL (ref 3.5–5.0)
Alkaline Phosphatase: 62 U/L (ref 38–126)
Anion gap: 9 (ref 5–15)
BUN: 13 mg/dL (ref 6–20)
CO2: 28 mmol/L (ref 22–32)
Calcium: 9.5 mg/dL (ref 8.9–10.3)
Chloride: 99 mmol/L (ref 98–111)
Creatinine, Ser: 0.8 mg/dL (ref 0.44–1.00)
GFR, Estimated: >60 mL/min (ref >60)
Glucose, Bld: 95 mg/dL (ref 70–99)
Potassium: 4 mmol/L (ref 3.5–5.1)
Sodium: 136 mmol/L (ref 135–145)
Total Bilirubin: 0.4 mg/dL (ref 0.3–1.2)
Total Protein: 7 g/dL (ref 6.5–8.1)

## 2021-10-19 LAB — CBC
HCT: 37 % (ref 36.0–46.0)
Hemoglobin: 12.6 g/dL (ref 12.0–15.0)
MCH: 30.8 pg (ref 26.0–34.0)
MCHC: 34.1 g/dL (ref 30.0–36.0)
MCV: 90.5 fL (ref 80.0–100.0)
Platelets: 246 10*3/uL (ref 150–400)
RBC: 4.09 MIL/uL (ref 3.87–5.11)
RDW: 12 % (ref 11.5–15.5)
WBC: 9 10*3/uL (ref 4.0–10.5)
nRBC: 0 % (ref 0.0–0.2)

## 2021-10-19 LAB — LIPASE, BLOOD: Lipase: 100 U/L — ABNORMAL HIGH (ref 11–51)

## 2021-10-19 LAB — URINALYSIS, ROUTINE W REFLEX MICROSCOPIC
Bilirubin Urine: NEGATIVE
Glucose, UA: NEGATIVE mg/dL
Hgb urine dipstick: NEGATIVE
Ketones, ur: NEGATIVE mg/dL
Leukocytes,Ua: NEGATIVE
Nitrite: NEGATIVE
Protein, ur: NEGATIVE mg/dL
Specific Gravity, Urine: 1.001 — ABNORMAL LOW (ref 1.005–1.030)
pH: 7 (ref 5.0–8.0)

## 2021-10-19 LAB — PREGNANCY, URINE: Preg Test, Ur: NEGATIVE

## 2021-10-19 MED ORDER — PANTOPRAZOLE SODIUM 20 MG PO TBEC: 20.0000 mg | DELAYED_RELEASE_TABLET | Freq: Every day | ORAL | 0 refills | Status: AC

## 2021-10-19 MED ORDER — DICYCLOMINE HCL 10 MG PO CAPS
10.0000 mg | ORAL_CAPSULE | Freq: Once | ORAL | Status: AC
  Administered 2021-10-19: 10 mg via ORAL
  Filled 2021-10-19: qty 1

## 2021-10-19 MED ORDER — PANTOPRAZOLE SODIUM 20 MG PO TBEC
20.0000 mg | DELAYED_RELEASE_TABLET | Freq: Every day | ORAL | Status: DC
  Filled 2021-10-19 (×2): qty 1

## 2021-10-19 MED ORDER — DICYCLOMINE HCL 20 MG PO TABS: 20.0000 mg | ORAL_TABLET | Freq: Two times a day (BID) | ORAL | 0 refills | Status: AC | PRN

## 2021-10-19 NOTE — ED Provider Notes (Signed)
[facility] EMERGENCY DEPARTMENT Provider Note   CSN: [id number] Arrival date & time: 10/19/21  0007     History  Chief Complaint  Patient presents with   Abdominal Pain   Back Pain    Natalie Ruiz is a 21 y.o. female.  21 year old female who presents the ER today with multiple complaints.  When asked to specify what she was most worried about she states that she has been having abdominal pain pretty much daily intermittently for 2 years.  Sometimes associated with nausea but not vomiting.  Sometimes feels bloated but not always.  Sometimes has not in her stomach but only in a certain position when doing certain things but not presently.  No fevers.  No constipation.  Denies she will have some diarrhea but does not seem to help her symptoms.  She also notes that sometimes she has "wavy muscles" where she feels like there is spasming in waves.  She also states that that she will have headaches, back pain, leg pain, neck pain and multiple other pains in her body.  She recently moved back from [place].  She tried Tylenol sometimes but does not seem to help much.  She is worried that she might have stomach cancer, peptic ulcer disease, urinary tract infection.  She tried drinking a bunch of water today that did not help.  Asymptomatic at this time.   Abdominal Pain Back Pain Associated symptoms: abdominal pain        Home Medications Prior to Admission medications   Medication Sig Start Date End Date Taking? Authorizing Provider  dicyclomine (BENTYL) 20 MG tablet Take 1 tablet (20 mg total) by mouth 2 (two) times daily as needed for spasms (abdominal cramping). 10/19/21  Yes Leny Morozov, Barbara Cower, MD  pantoprazole (PROTONIX) 20 MG tablet Take 1 tablet (20 mg total) by mouth daily for 14 days. 10/19/21 11/02/21 Yes Zaylei Mullane, Barbara Cower, MD  hydrOXYzine (ATARAX/VISTARIL) 25 MG tablet Take 1 tablet (25 mg total) by mouth every 6 (six) hours. 08/02/19   Henderly, Britni A, PA-C  permethrin (ELIMITE) 5 %  cream Massage cream from head to soles of feet; leave on for 8 to 14 hours before removing (shower or bath); Patient not taking: Reported on 08/02/2019 03/07/19   Tommie Sams, DO  polyethylene glycol (MIRALAX) 17 g packet Take 17 g by mouth daily. 08/02/19   Henderly, Britni A, PA-C  fluticasone (FLONASE) 50 MCG/ACT nasal spray Place 1 spray into both nostrils 2 (two) times daily. 07/27/17 12/10/18  Cuthriell, Delorise Royals, PA-C      Allergies    Patient has no known allergies.    Review of Systems   Review of Systems  Gastrointestinal:  Positive for abdominal pain.  Musculoskeletal:  Positive for back pain.    Physical Exam Updated Vital Signs BP 112/61 (BP Location: Right Arm)   Pulse 70   Temp 98.2 F (36.8 C) (Oral)   Resp 14   Ht 5\' 3"  (1.6 m)   Wt 59 kg   SpO2 97%   BMI 23.03 kg/m  Physical Exam Vitals and nursing note reviewed.  Constitutional:      Appearance: She is well-developed.  HENT:     Head: Normocephalic and atraumatic.  Cardiovascular:     Rate and Rhythm: Normal rate and regular rhythm.  Pulmonary:     Effort: No respiratory distress.     Breath sounds: No stridor.  Abdominal:     General: There is no distension.     Palpations:  There is no shifting dullness, fluid wave, hepatomegaly or splenomegaly.     Tenderness: There is no abdominal tenderness. There is no right CVA tenderness, left CVA tenderness, guarding or rebound.     Hernia: No hernia is present.  Musculoskeletal:     Cervical back: Normal range of motion.  Skin:    General: Skin is warm and dry.  Neurological:     Mental Status: She is alert.     ED Results / Procedures / Treatments   Labs (all labs ordered are listed, but only abnormal results are displayed) Labs Reviewed  LIPASE, BLOOD - Abnormal; Notable for the following components:      Result Value   Lipase 100 (*)    All other components within normal limits  URINALYSIS, ROUTINE W REFLEX MICROSCOPIC - Abnormal; Notable for  the following components:   Color, Urine COLORLESS (*)    Specific Gravity, Urine 1.001 (*)    All other components within normal limits  COMPREHENSIVE METABOLIC PANEL  CBC  PREGNANCY, URINE    EKG None  Radiology No results found.  Procedures Procedures    Medications Ordered in ED Medications  pantoprazole (PROTONIX) EC tablet 20 mg (has no administration in time range)  dicyclomine (BENTYL) capsule 10 mg (10 mg Oral Given 10/19/21 0358)    ED Course/ Medical Decision Making/ A&P                           Medical Decision Making Amount and/or Complexity of Data Reviewed Labs: ordered.  Risk Prescription drug management.   May be IBS versus gastritis/GERD as her primary diagnosis.  We will initiate Protonix and Bentyl.  Encouraged PCP follow-up.  She states she does not have 1 I pointed out that they will be a phone number on her paperwork that she can look into for this.  Consider possible admission/consultation with GI for further work-up however without any overt signs of dehydration, significant abdominal discomfort I think that is unnecessary at this time and can be managed as an outpatient.  Also consider possible CT scan however to years of symptoms without significant weight loss, severe pain, fever or any other associated symptoms with it is unlikely that she has some type of malignancy or infection going on making that necessary.  Final Clinical Impression(s) / ED Diagnoses Final diagnoses:  Epigastric pain    Rx / DC Orders ED Discharge Orders          Ordered    dicyclomine (BENTYL) 20 MG tablet  2 times daily PRN        10/19/21 0351    pantoprazole (PROTONIX) 20 MG tablet  Daily        10/19/21 0351              Sherill Mangen, Corene Cornea, MD 10/19/21 [phone]

## 2021-10-19 NOTE — ED Notes (Signed)
ED Provider at bedside. 

## 2021-10-19 NOTE — ED Triage Notes (Signed)
Pt states she is here with many different complaints. Pt states main complaint is abd pain for the past 2 years. Pt states she hasn't been seen for the abd pain in the past 2 years.
